# Patient Record
Sex: Female | Born: 1985 | Race: Black or African American | Hispanic: No | Marital: Single
Health system: Southern US, Community
[De-identification: ages and names within clinical notes are randomized; demographics above are authoritative.]

---

## 2007-01-04 ENCOUNTER — Emergency Department (HOSPITAL_COMMUNITY): Admission: EM | Admit: 2007-01-04 | Discharge: 2007-01-05 | Payer: Self-pay | Admitting: Emergency Medicine

## 2007-01-05 ENCOUNTER — Emergency Department (HOSPITAL_COMMUNITY): Admission: EM | Admit: 2007-01-05 | Discharge: 2007-01-05 | Payer: Self-pay | Admitting: Podiatry

## 2007-03-08 ENCOUNTER — Emergency Department (HOSPITAL_COMMUNITY): Admission: EM | Admit: 2007-03-08 | Discharge: 2007-03-08 | Payer: Self-pay | Admitting: Emergency Medicine

## 2007-04-13 ENCOUNTER — Emergency Department (HOSPITAL_COMMUNITY): Admission: EM | Admit: 2007-04-13 | Discharge: 2007-04-13 | Payer: Self-pay | Admitting: Emergency Medicine

## 2008-05-25 ENCOUNTER — Emergency Department (HOSPITAL_COMMUNITY): Admission: EM | Admit: 2008-05-25 | Discharge: 2008-05-25 | Payer: Self-pay | Admitting: Emergency Medicine

## 2008-09-22 IMAGING — CR DG CHEST 2V
2 series · 2 of 2 positions shown · non-contrast
Comparison: None.

CLINICAL DATA: Chest pain/dyspnea.  
 CHEST - 2 VIEW:

[w chest pa]
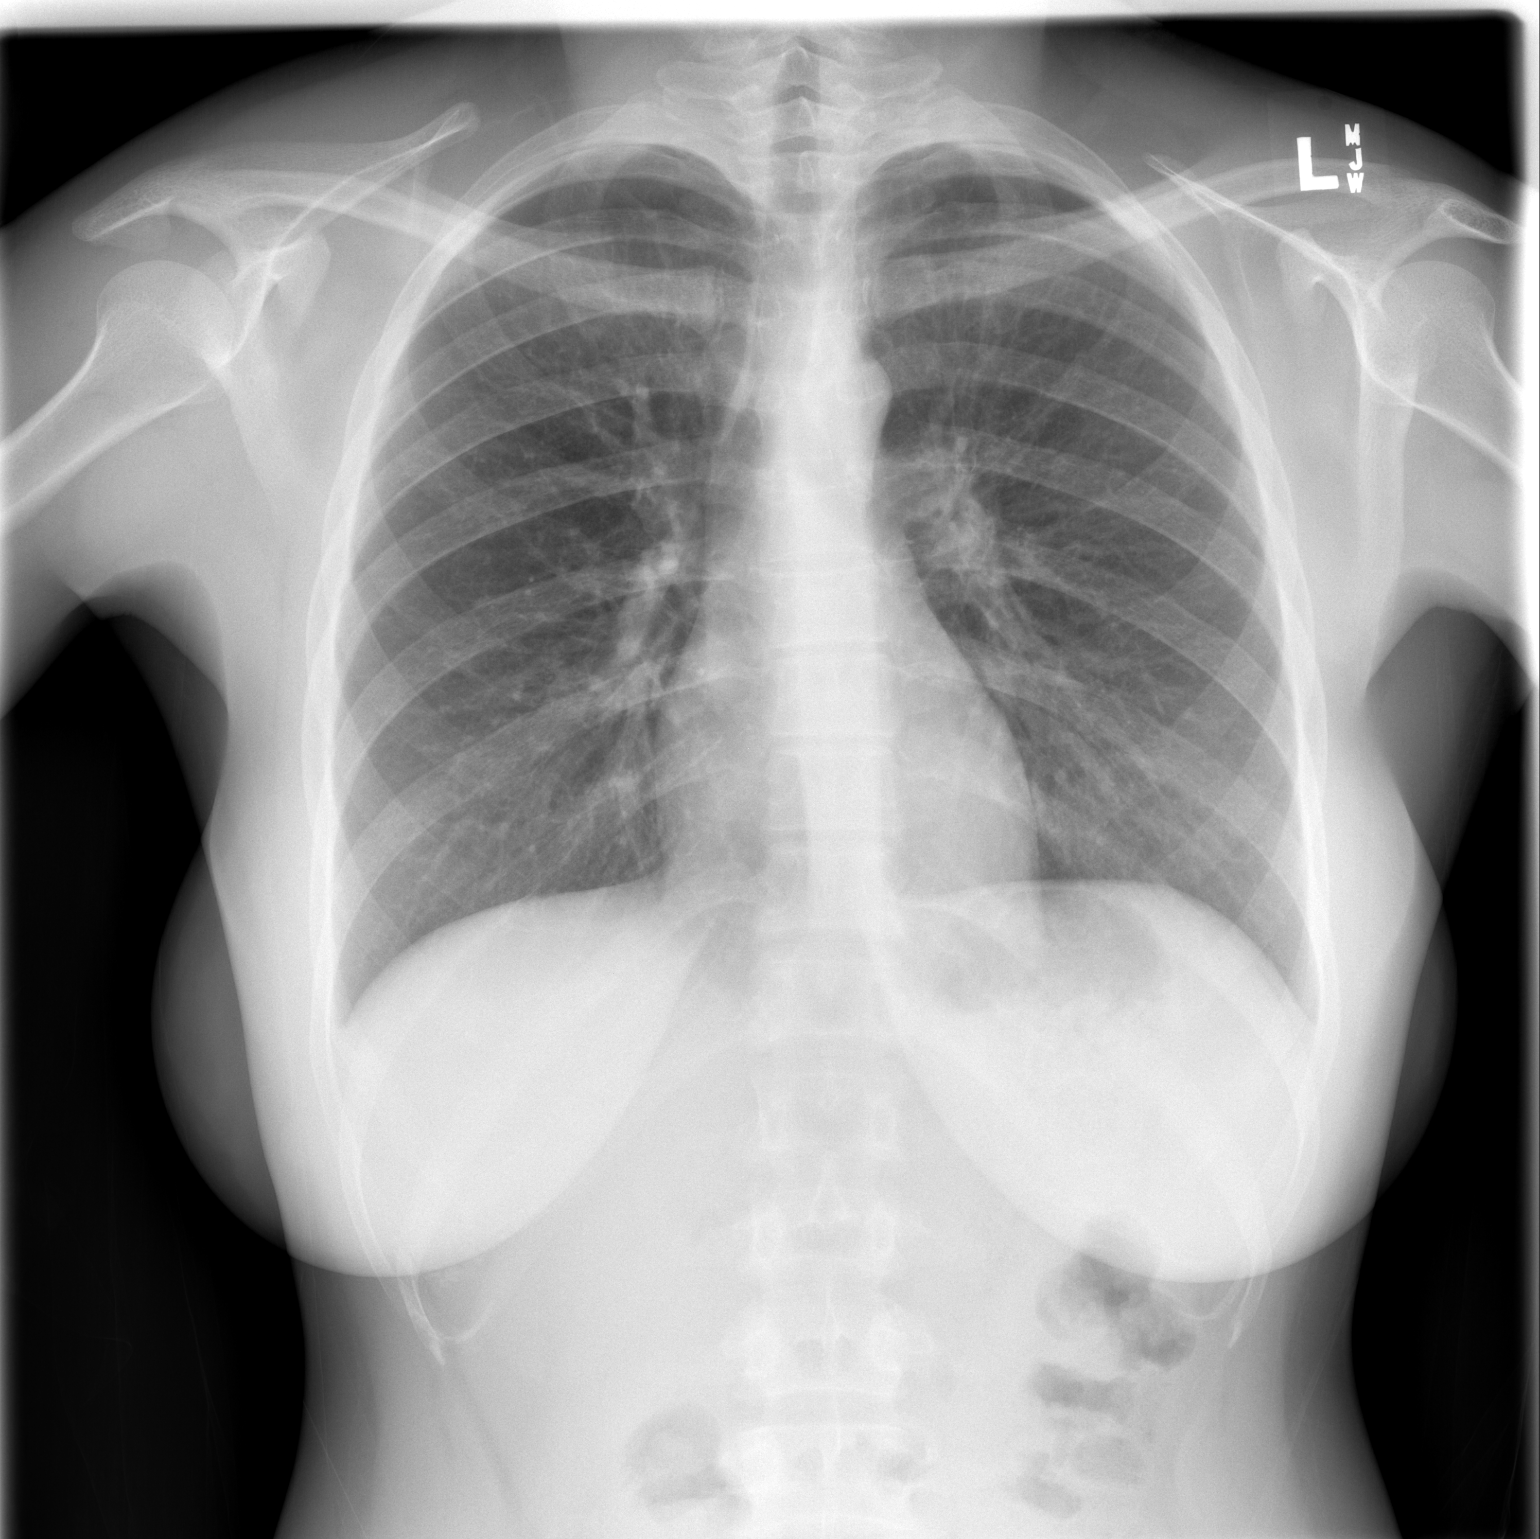

[w chest lat]
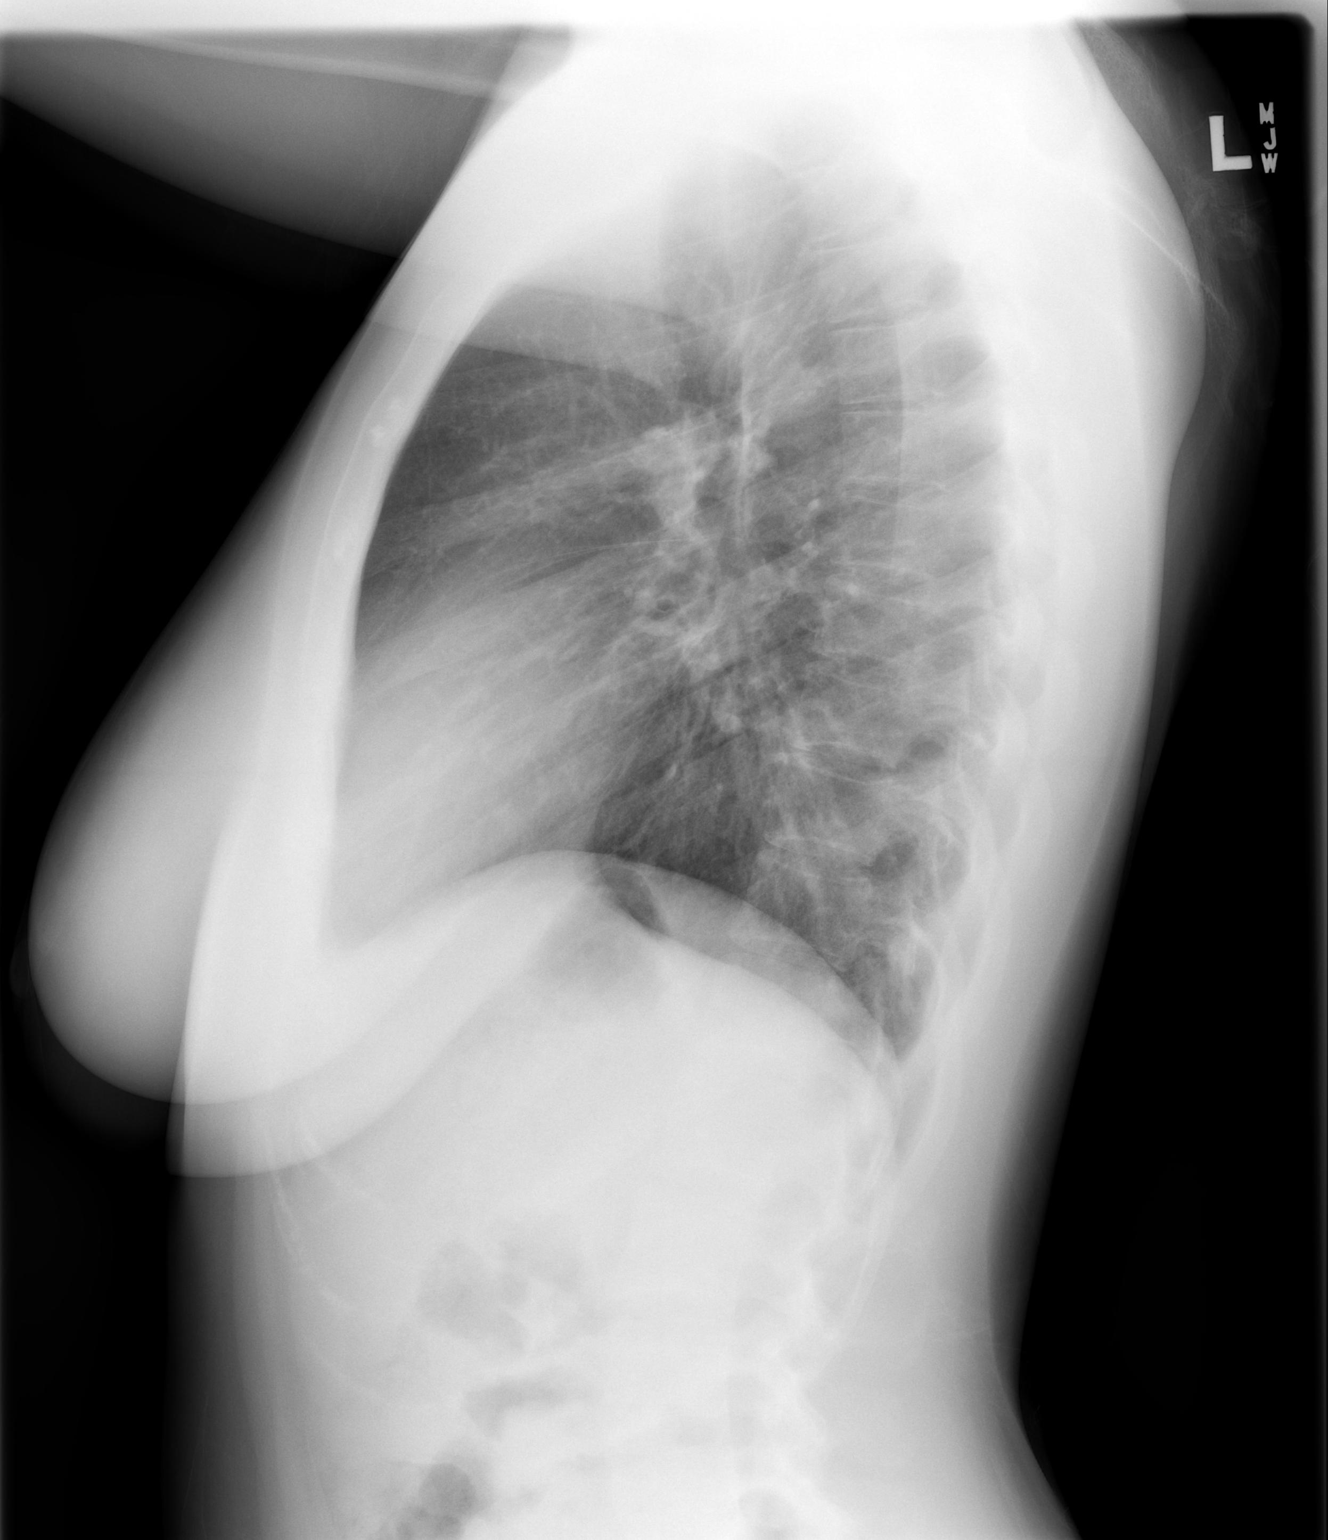

[2 of 2 positions shown; findings below may reference images not displayed]

FINDINGS: The heart size and mediastinal contours are within normal limits.  Both lungs are clear.  The visualized skeletal structures are within normal limits.
IMPRESSION: No active cardiopulmonary disease.

## 2009-09-29 ENCOUNTER — Emergency Department: Payer: Self-pay | Admitting: Emergency Medicine

## 2009-10-25 ENCOUNTER — Ambulatory Visit: Payer: Self-pay | Admitting: Internal Medicine

## 2009-12-10 IMAGING — CR DG CHEST 2V
2 series · 2 of 2 positions shown · non-contrast
Comparison: 03/08/2007 study.

CLINICAL DATA: History given of shortness of breath, coughing,
asthma, history of upper respiratory infection.

CHEST - 2 VIEW

[w chest pa]
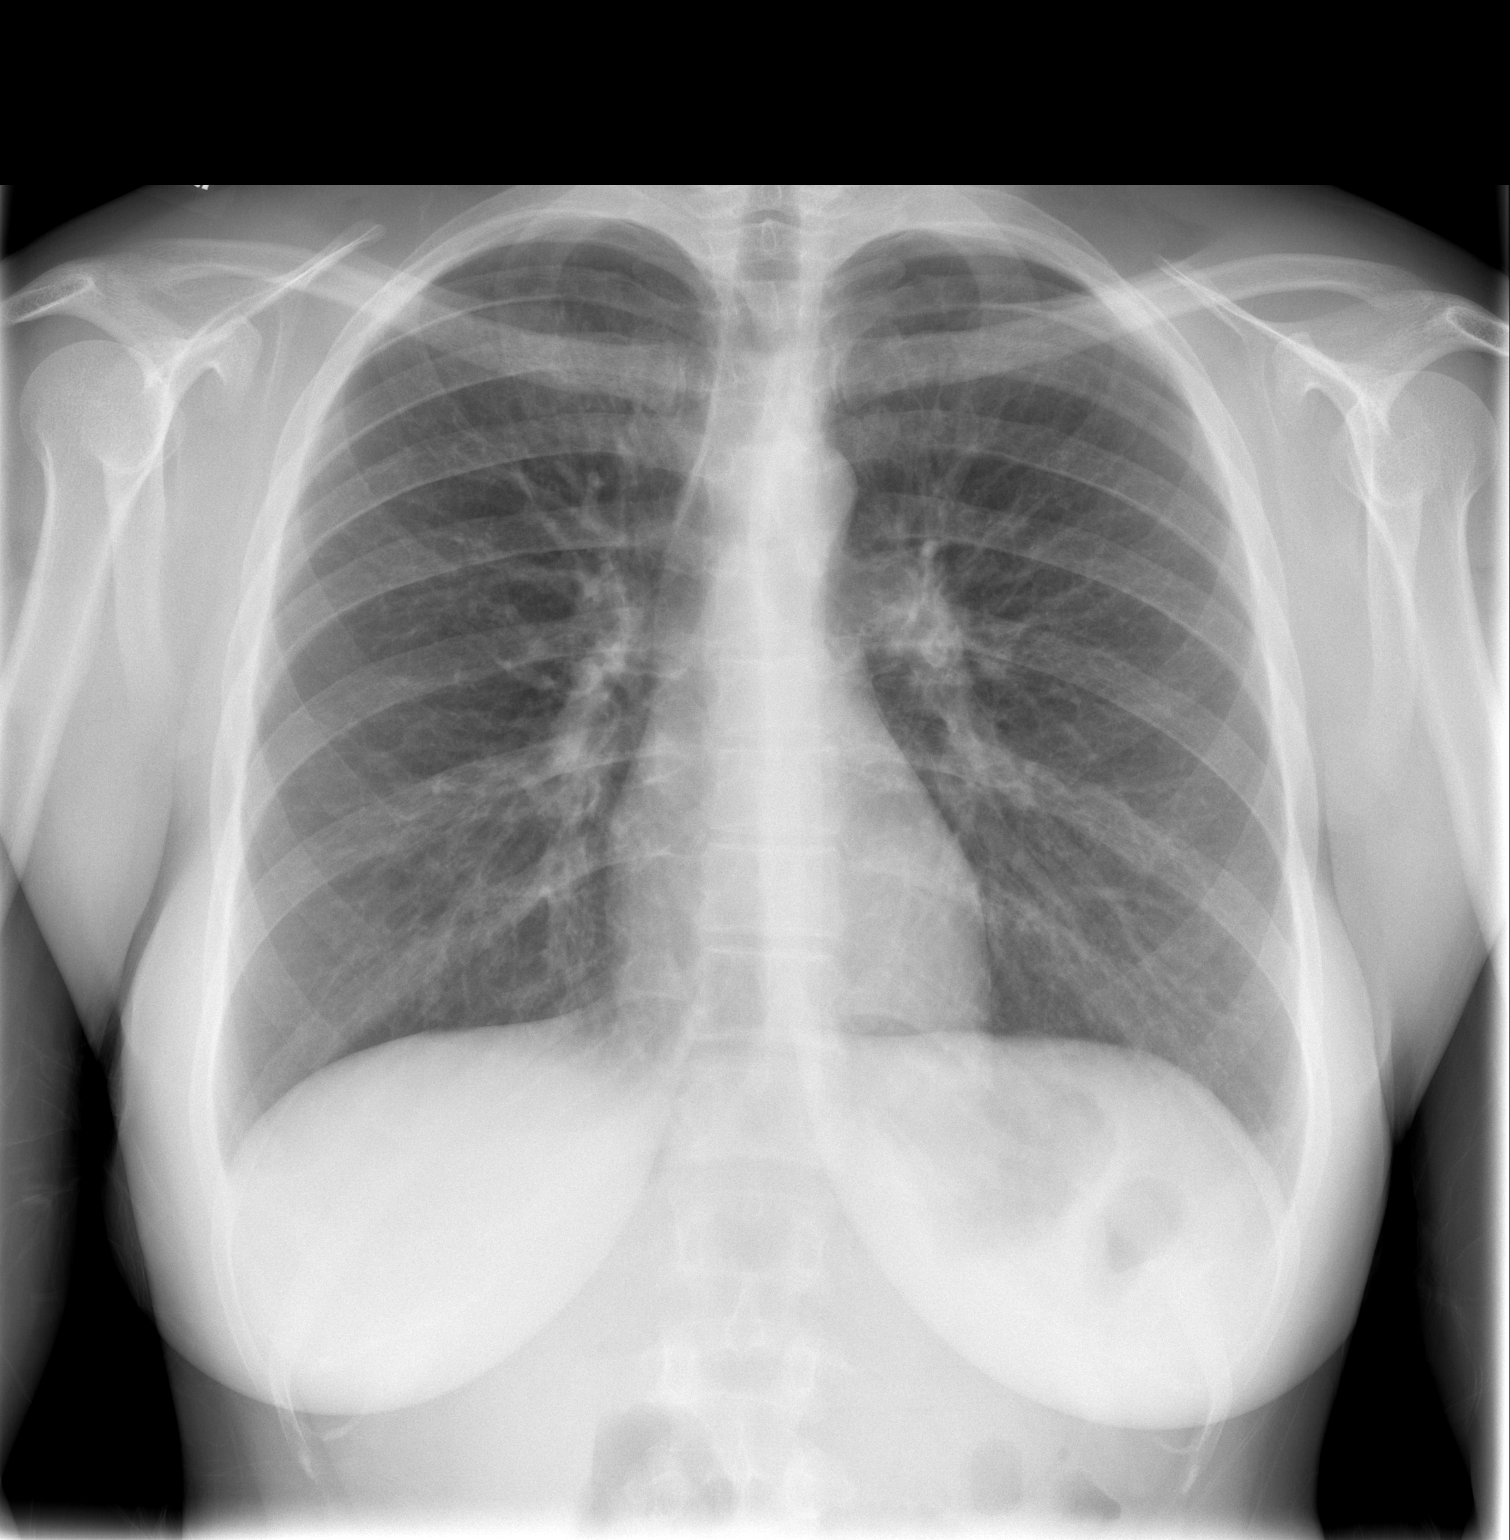

[w chest lat]
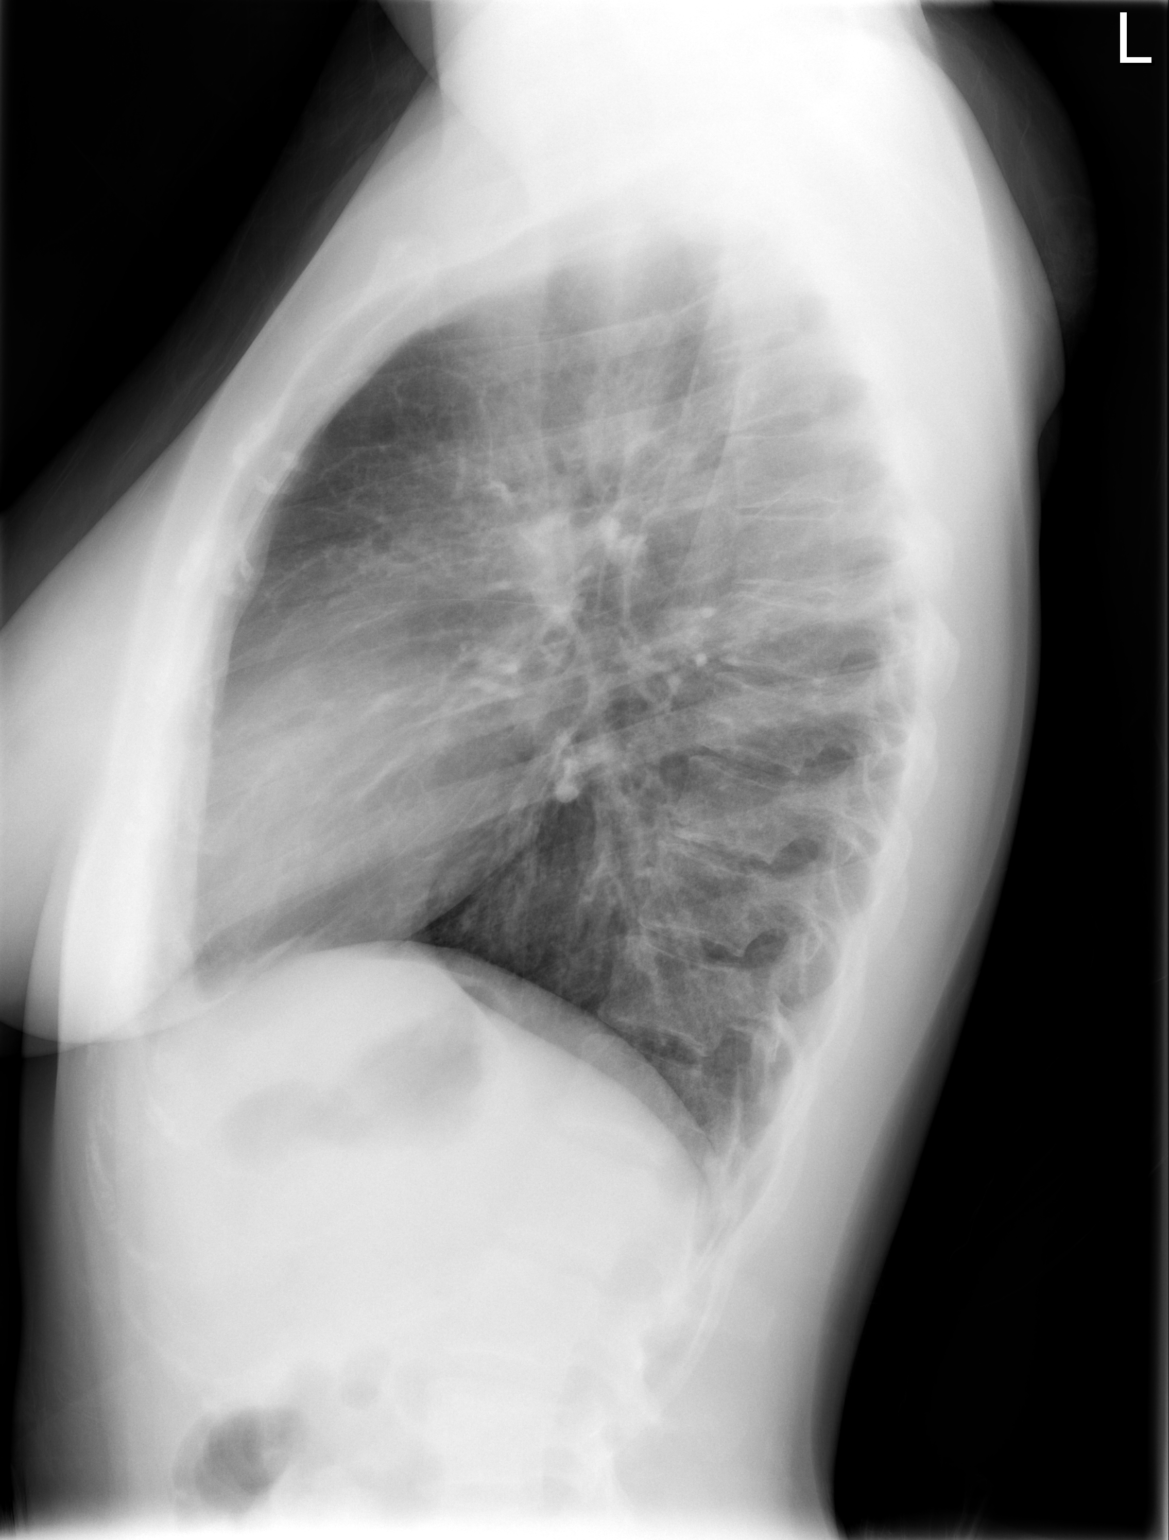

[2 of 2 positions shown; findings below may reference images not displayed]

FINDINGS: Cardiac silhouette is normal size and shape.  Lungs are
free of infiltrates.  No pleural effusion is evident.  Bones appear
average for age. Slight flattening of diaphragm on lateral image
may reflect minimal hyperinflation. There is central peribronchial
thickening.
IMPRESSION: Slight flattening of diaphragm on lateral image may reflect a
slight hyperinflation configuration. There is central peribronchial
thickening.  No pneumonia or pleural effusion or other acute
process is identified.

## 2009-12-25 ENCOUNTER — Ambulatory Visit: Payer: Self-pay | Admitting: Family Medicine

## 2010-12-04 LAB — POCT PREGNANCY, URINE
Operator id: 19830
Preg Test, Ur: NEGATIVE

## 2010-12-04 LAB — HERPES SIMPLEX VIRUS CULTURE

## 2010-12-04 LAB — URINALYSIS, ROUTINE W REFLEX MICROSCOPIC
Hgb urine dipstick: NEGATIVE
Nitrite: NEGATIVE
Specific Gravity, Urine: 1.015

## 2010-12-04 LAB — WET PREP, GENITAL
Clue Cells Wet Prep HPF POC: NONE SEEN
Yeast Wet Prep HPF POC: NONE SEEN

## 2010-12-04 LAB — URINE CULTURE

## 2010-12-04 LAB — RPR: RPR Ser Ql: NONREACTIVE

## 2010-12-24 LAB — CULTURE, ROUTINE-ABSCESS

## 2011-03-09 ENCOUNTER — Observation Stay: Payer: Self-pay | Admitting: *Deleted

## 2012-09-23 IMAGING — CR DG CHEST 2V
1 series · 2 of 2 positions shown · non-contrast
Comparison: none

REASON FOR EXAM: Shortness of Breath
COMMENTS:   May transport without cardiac monitor

PROCEDURE:     DXR - DXR CHEST PA (OR AP) AND LATERAL  - March 09, 2011  [DATE]
RESULT:     The lungs are clear. The cardiac silhouette and visualized bony
skeleton are unremarkable.

[Series 1: pa · 0.17mm/px · 2 of 2 slices shown]
[im 1/2]
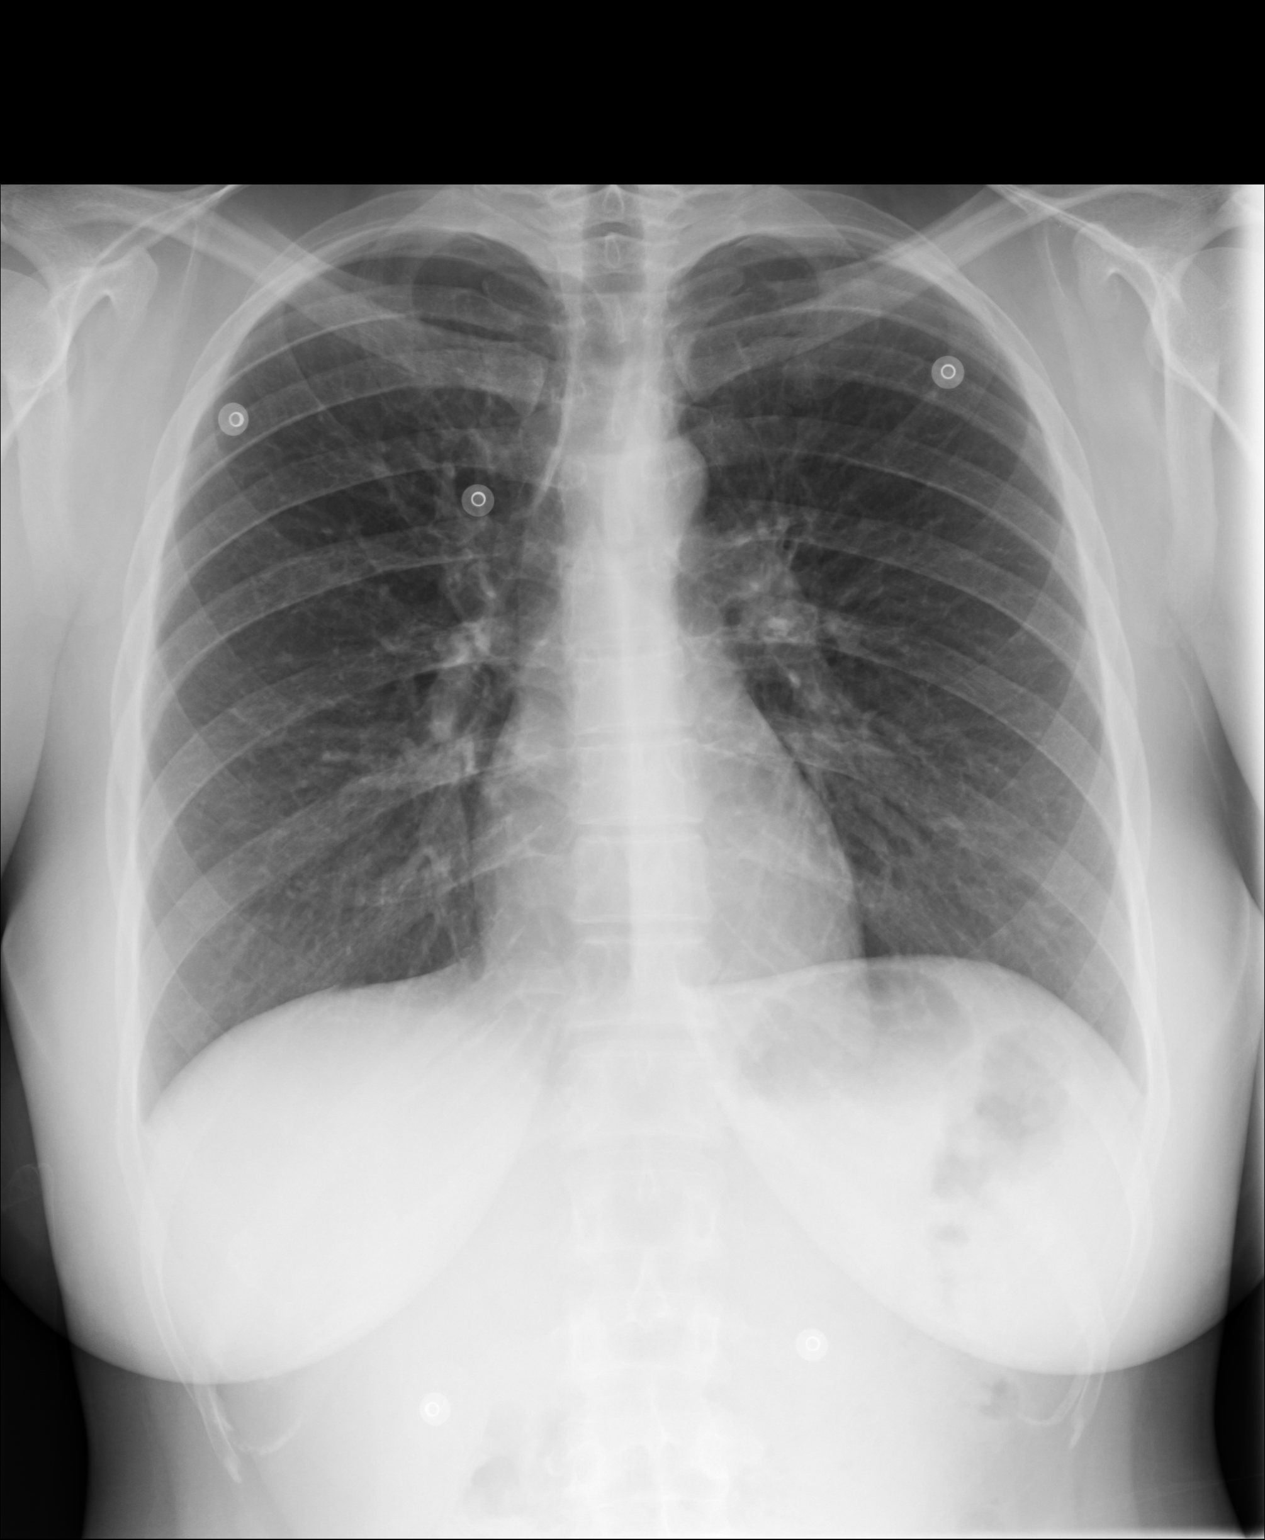
[im 2/2]
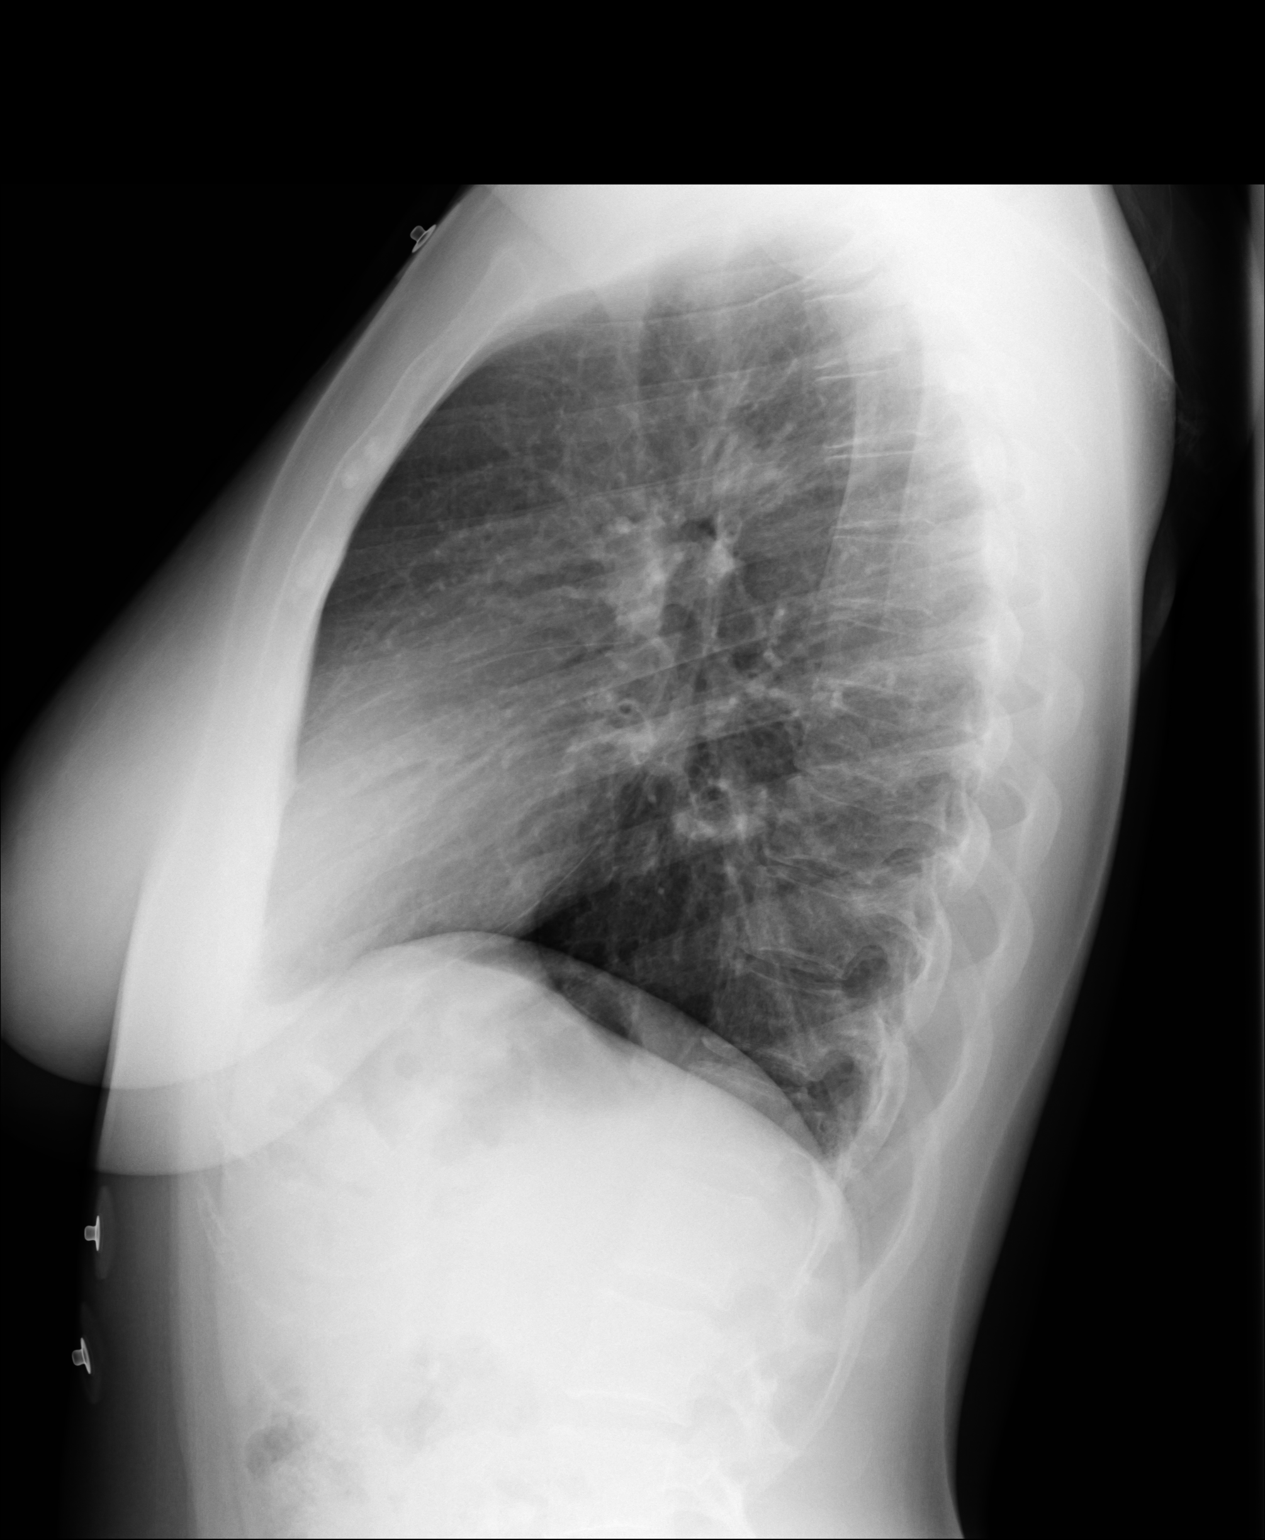

[2 of 2 positions shown; findings below may reference images not displayed]

IMPRESSION: 1. Chest radiograph without evidence of acute cardiopulmonary disease.

## 2014-07-08 NOTE — Discharge Summary (Signed)
PATIENT NAME:  Lori Friedman, Lori Friedman MR#:  [id number] DATE OF BIRTH:  14-Oct-1985  DATE OF ADMISSION:  03/09/2011 DATE OF DISCHARGE:  03/12/2011  DISCHARGE DIAGNOSIS:  Acute asthma exacerbation.  HOSPITAL COURSE: 29 year old female with history of asthma, appears to have moderate intermittent to persistent asthma. She presented with difficulty breathing. She also recently had Friedman cold, sore throat, and dyspnea. In the Emergency Room she was having audible wheezing, tachypnea, with Friedman respiratory rate around 20 to 30. She was admitted as acute asthma exacerbation. She was started on Duo-Nebs and IV Solu-Medrol. She also got Friedman dose of magnesium sulfate. At admission she had Friedman normal white count of 7.1, normal hemoglobin of 13.1. She was slightly hypokalemic with Friedman potassium of 3.3, which was replaced. Her creatinine was normal at 0.9. LFTs were normal. Her chest x-ray essentially was negative, no acute cardiopulmonary disease. She was Friedman slow responder. She continued Duo-Nebs every four hours, IV Solu-Medrol. She was given Zyrtec, Mucinex, and Flovent was also started on Friedman regimen. She was complaining of some greenish phlegm, so Zithromax was added to the regimen also. On 12/25 her creatinine remained stable at 0.63, potassium was stable at 4.1. Her blood cultures remained negative. Her wheezing has resolved right now. She has slightly prolonged expiratory phase. Her FEV1 has improved, forced expiratory volume of greater than 350 mL. I am going to discharge her on albuterol nebulizers as needed along with albuterol metered dose inhaler. I have also started her on Flovent MDI. We will give her Friedman long prednisone taper and antibiotics.  DISCHARGE MEDICATIONS:  1. Albuterol 1 puff as needed. 2. Cetirizine 10 mg once daily.  3. Albuterol nebulizers q.  4 to 6 hours as needed for shortness of breath or wheezing. 4. Zithromax 250 mg p.o. once daily for three days.  5. Prednisone taper. 6. Flovent metered-dose inhaler 110  mcg, 2 puffs q.12 hours.  7. Albuterol metered dose inhaler 2 puffs q. 4-6 hours as needed when not using the nebulizer.  8. She said she already has Mucinex at home.   CONDITION AT DISCHARGE: Comfortable. She is speaking in full sentences, ambulating around. T-max 98.1, heart rate 104-114,  blood pressure 113/61. Saturating 95% on room air. Chest is clear, no wheezing, slightly prolonged expiratory phase.  Abdomen soft, nontender.   DIET: Regular diet.   FOLLOWUP:  The patient is to follow up at the [facility] in 1 to 2 weeks. If any worsening of shortness of breath come back to the ER.    TIME SPENT ON DISCHARGE: 40 mins   ____________________________ Fredia SorrowAbhinav Deontay Ladnier, MD ag:bjt D: 03/12/2011 14:55:53 ET T: 03/13/2011 11:05:41 ET JOB#: [id number]  cc: Fredia SorrowAbhinav Meleane Selinger, MD, <Dictator> [facility] Fredia SorrowABHINAV Kyndall Amero MD ELECTRONICALLY SIGNED 04/03/2011 12:13
